# Patient Record
Sex: Female | Born: 1995 | Race: Black or African American | Hispanic: No | Marital: Single | Smoking: Never smoker
Health system: Southern US, Community
[De-identification: ages and names within clinical notes are randomized; demographics above are authoritative.]

---
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
## 2015-03-13 IMAGING — US US ABDOMEN LIMITED
1 series · 14 of 25 positions shown · non-contrast
Comparison: None
COMPARISON: None.

CLINICAL HISTORY: Pelvic pain

EXAM:
ULTRASOUND RIGHT LOWER QUADRANT
CLINICAL DATA: Pelvic pain
TRANSABDOMINAL ULTRASOUND OF PELVIS
TECHNIQUE: Transabdominal ultrasound examination of the pelvis was performed
including evaluation of the uterus, ovaries, adnexal regions, and
pelvic cul-de-sac.

[Series 1: us abdomen limited · 0.22mm/px · 14 of 63 slices shown]
[im 1/63]
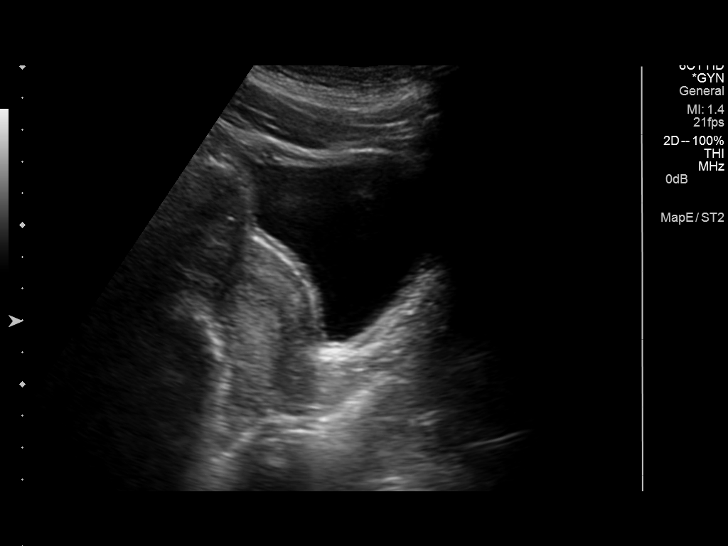
[im 6/63]
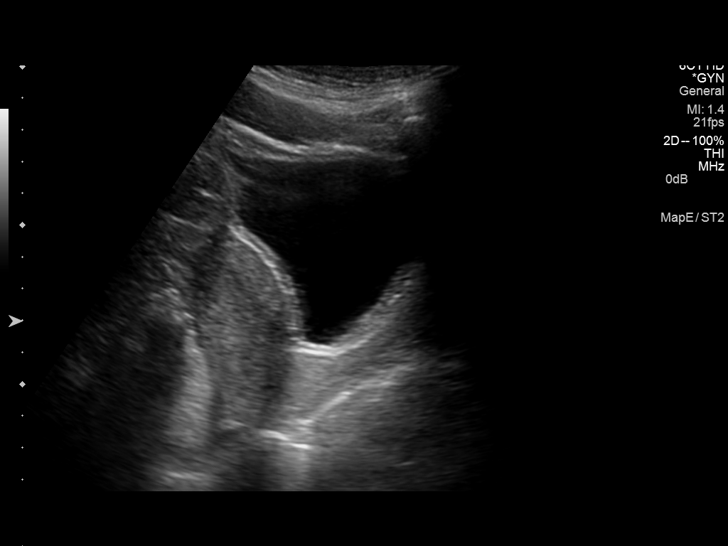
[im 11/63]
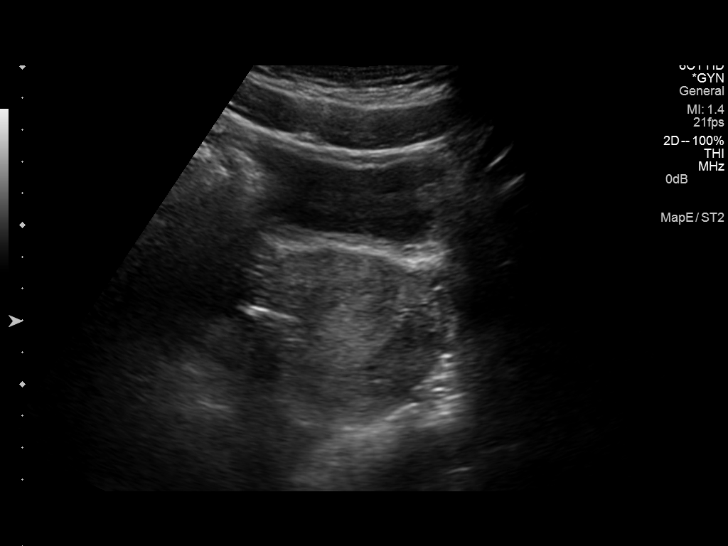
[im 16/63]
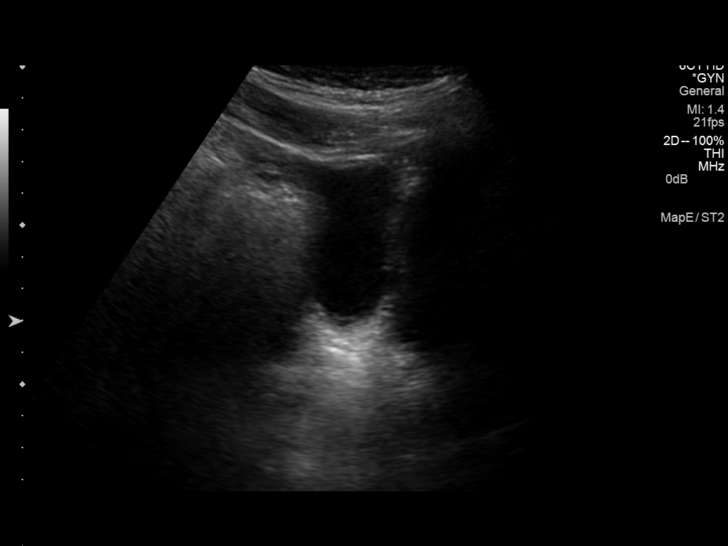
[im 21/63]
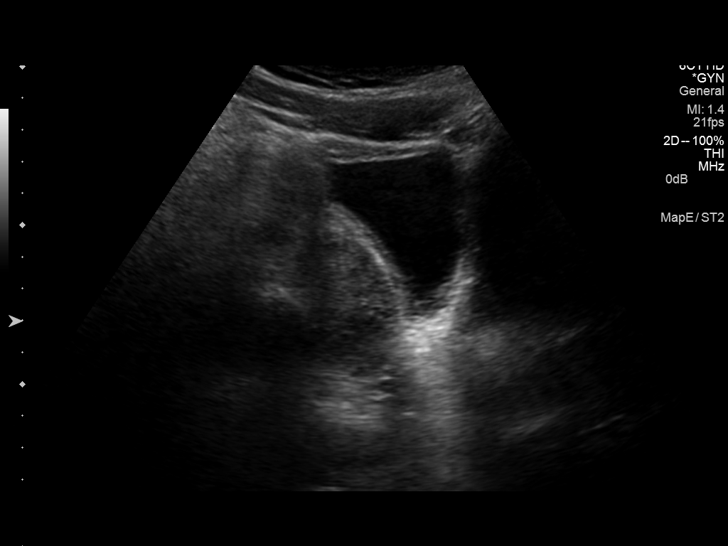
[im 24/63]
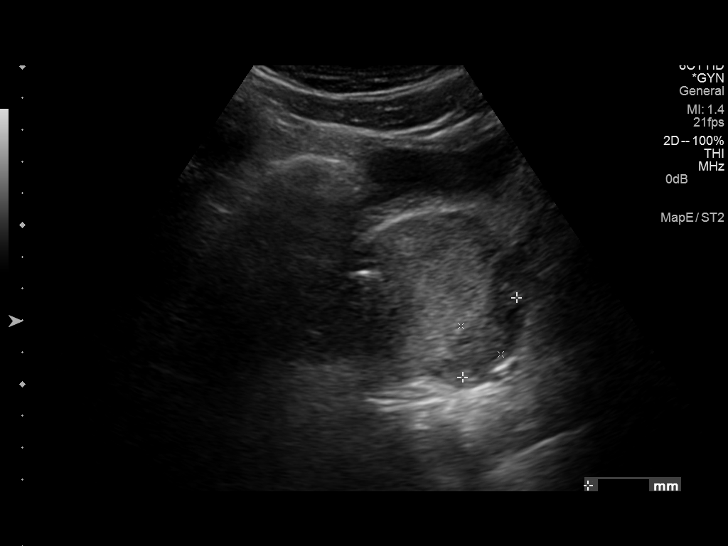
[im 29/63]
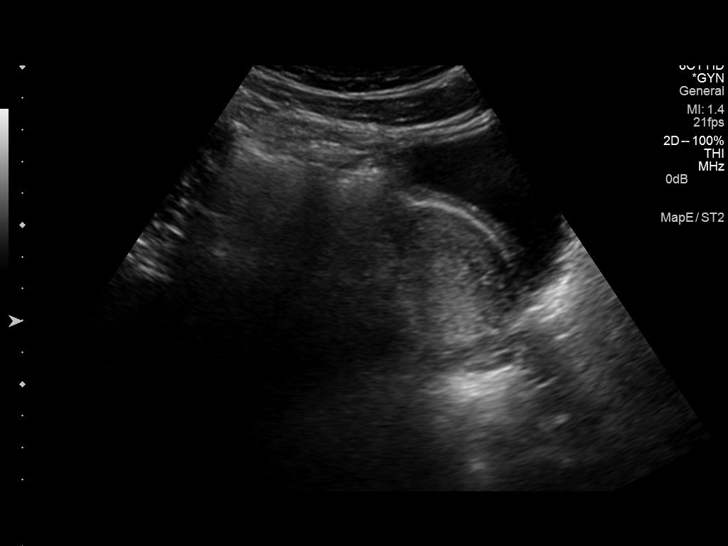
[im 34/63]
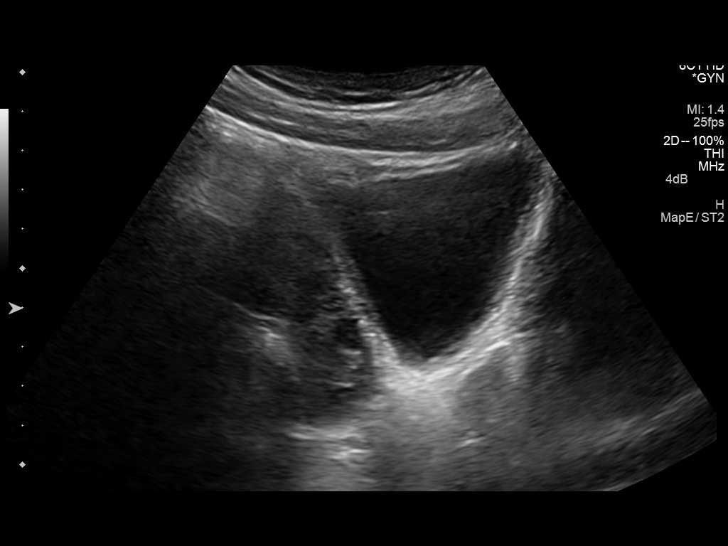
[im 39/63]
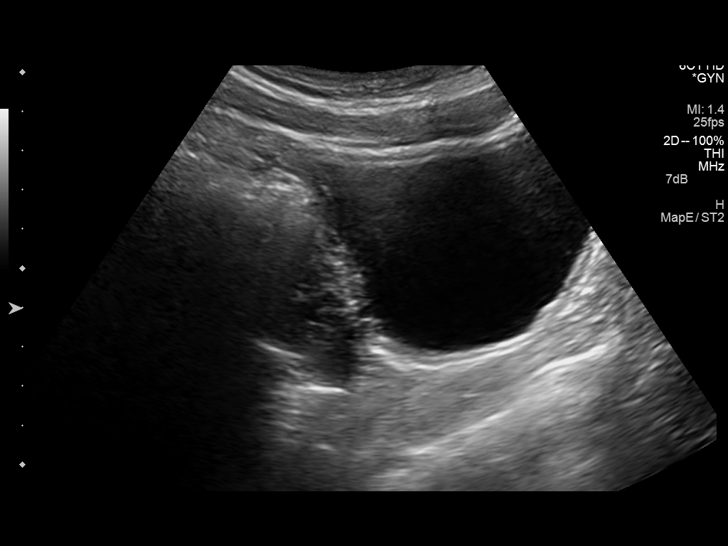
[im 42/63]
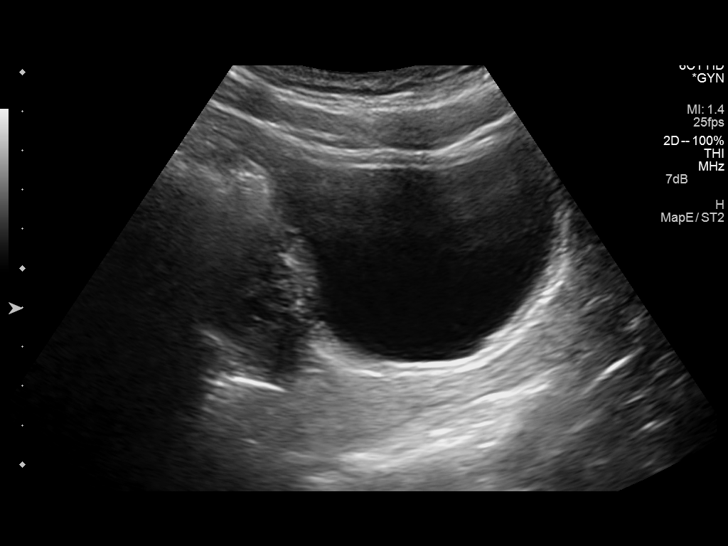
[im 47/63]
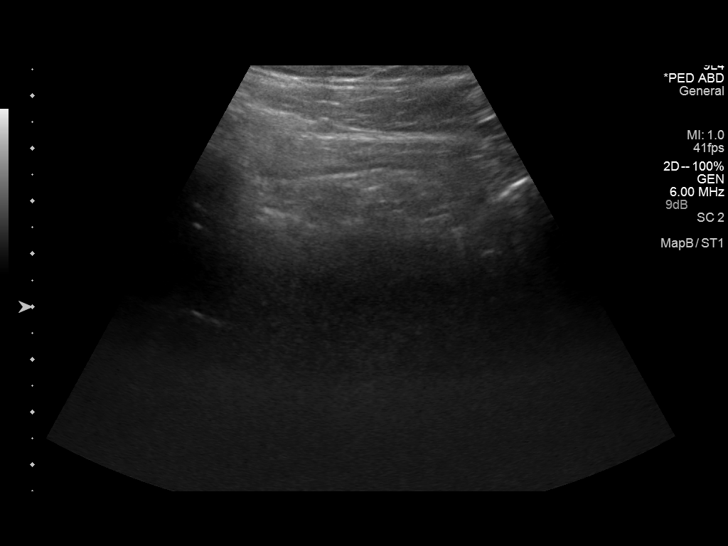
[im 52/63]
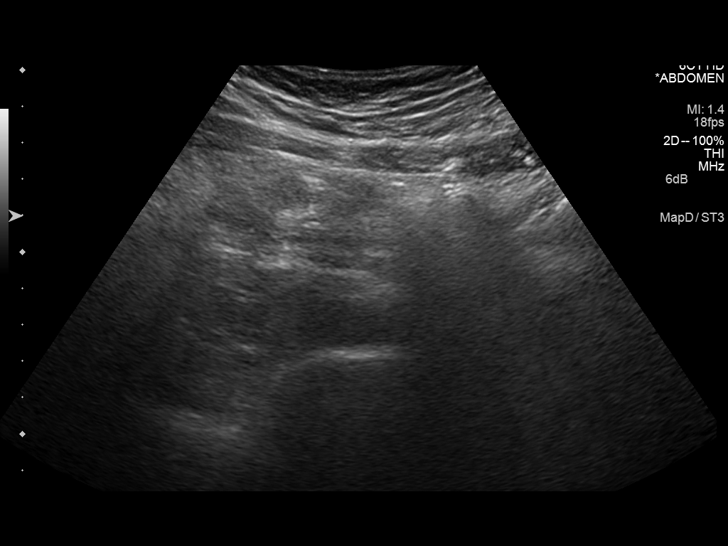
[im 57/63]
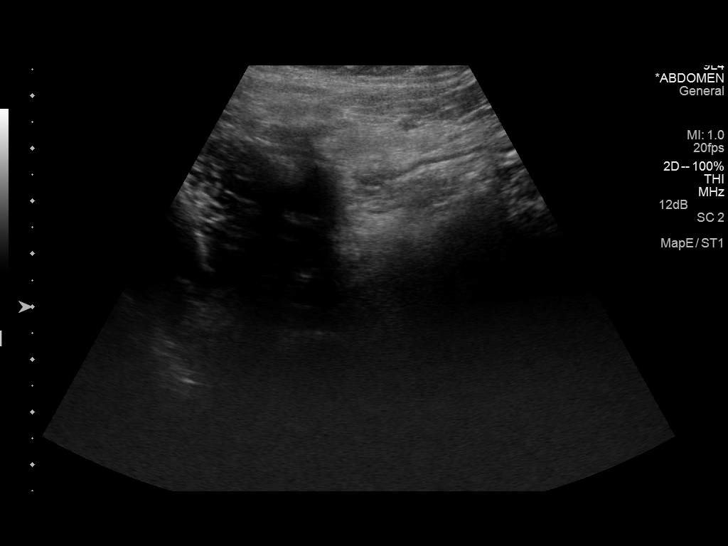
[im 63/63]
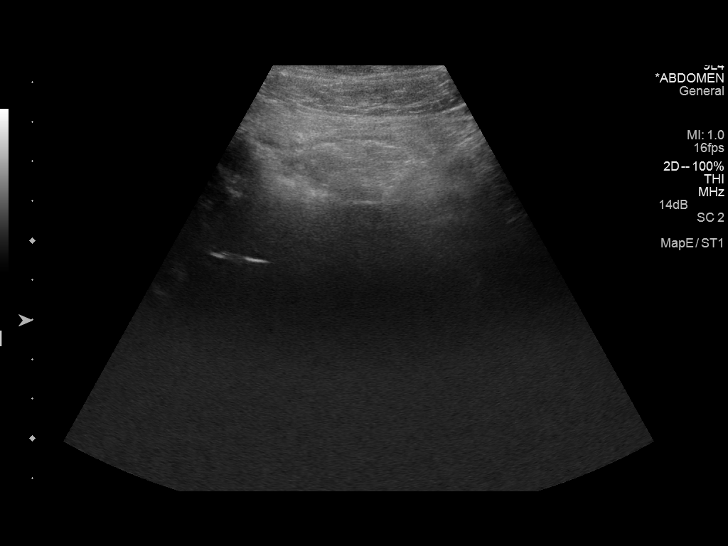

[14 of 25 positions shown; findings below may reference images not displayed]

FINDINGS: Ultrasound of the right lower quadrant was performed using graded
compression. There is no dilated tubular structure as is expected
with acute appendicitis. There is peristalsing bowel in this area.
No evidence of abscess or abnormal fluid. Note that normal appendix
is not seen.
IMPRESSION: No evidence of appendiceal inflammation in the right lower quadrant
by ultrasound. Normal appendix not seen on this study.
FINDINGS: Uterus

Measurements: 5.7 x 3.1 x 4.5 cm. No fibroids or other mass
visualized. Uterus is anteverted.

Endometrium

Thickness: 5 mm..  No focal abnormality visualized.

Right ovary

Measurements: 2.8 x 2.3 x 1.5 cm. Normal appearance/no adnexal mass.

Left ovary

Measurements: 3.0 x 1.5 x 1.1 cm. Normal appearance/no adnexal mass.

Other findings:  No free fluid
IMPRESSION: Study within normal limits.

## 2015-03-13 IMAGING — CT CT ABD-PELV W/ CM
2 of 4 series · 16 of 46 positions shown, 18 images · IV contrast (Omni 300)
Comparison: Abdominal ultrasound earlier today

CLINICAL DATA: Right greater than left lower abdominal/ pelvic pain
for 1 week. Normal white blood cell count. Recently diagnosed with
urinary tract infection.

EXAM:
CT ABDOMEN AND PELVIS WITH CONTRAST
TECHNIQUE: Multidetector CT imaging of the abdomen and pelvis was performed
using the standard protocol following bolus administration of
intravenous contrast.
CONTRAST:  100mL OMNIPAQUE IOHEXOL 300 MG/ML  SOLN

[Series 2: abd/ pelvis 5.0 i30f 1 · axial · 0.73mm/px · z∈[+1035,+1480]mm · 13 of 99 slices shown, 15 images]
[im 5/99  soft-tissue]
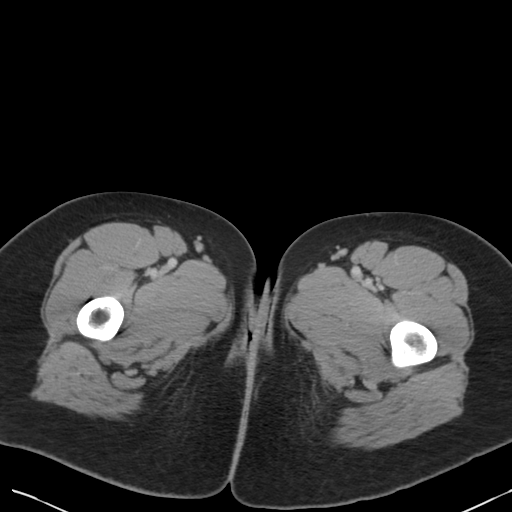
[im 5/99  bone]
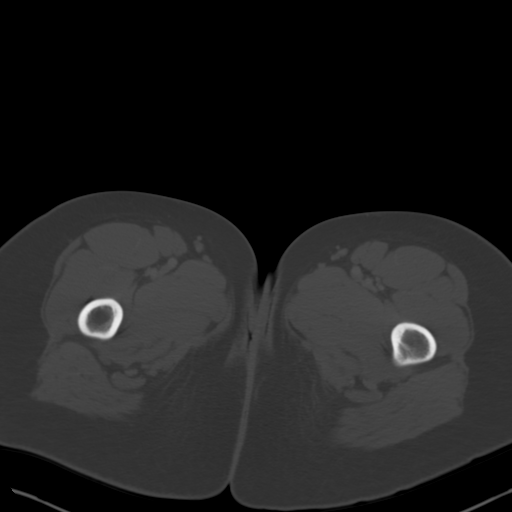
[im 13/99  soft-tissue]
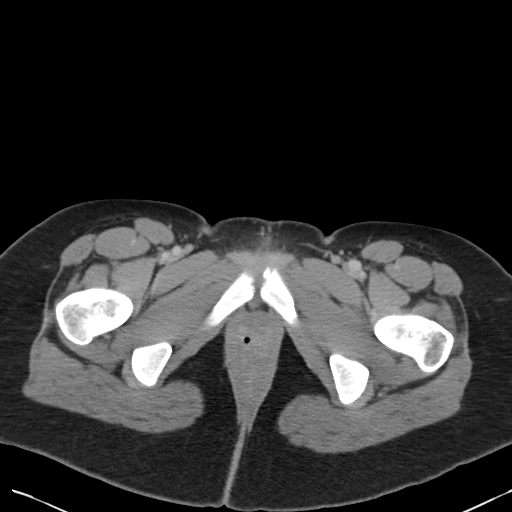
[im 22/99  soft-tissue]
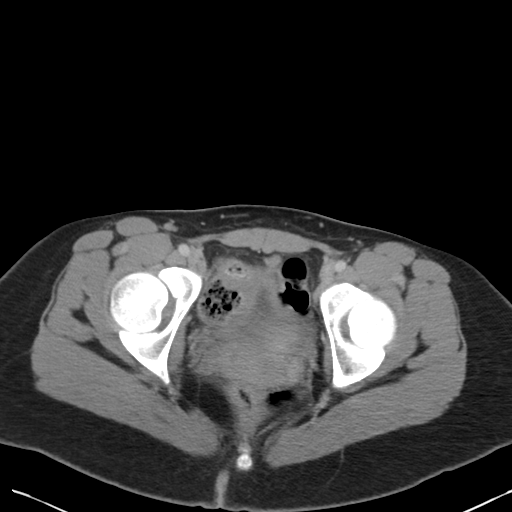
[im 26/99  soft-tissue]
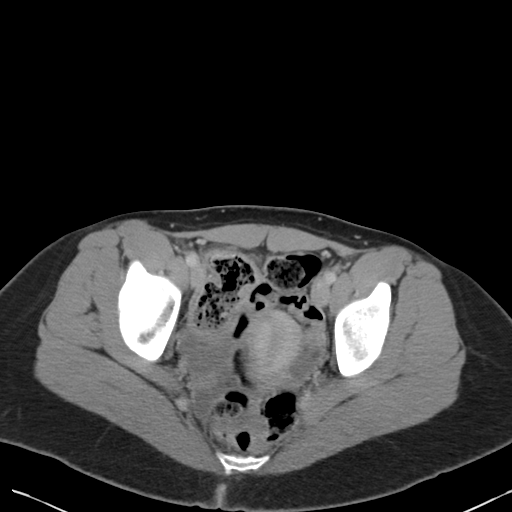
[im 35/99  soft-tissue]
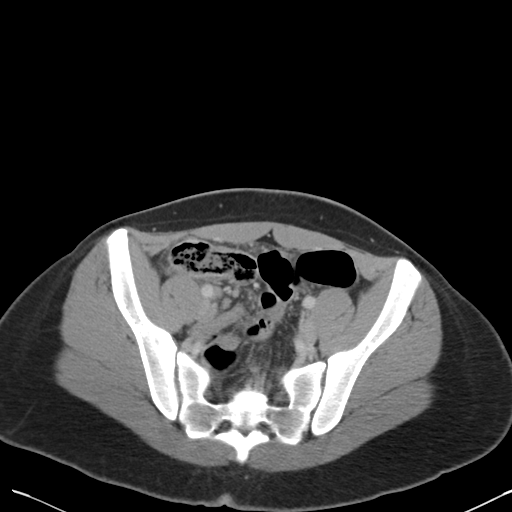
[im 43/99  soft-tissue]
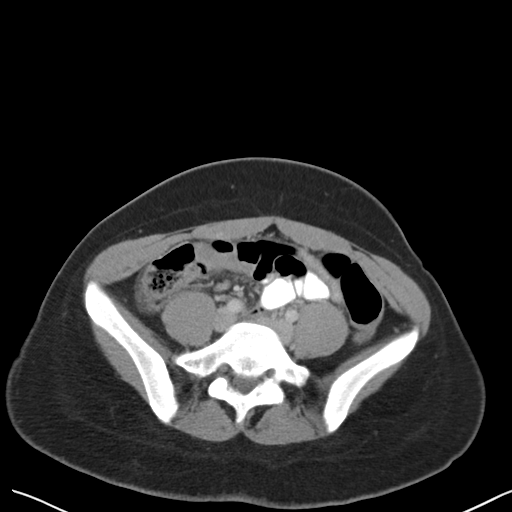
[im 52/99  soft-tissue]
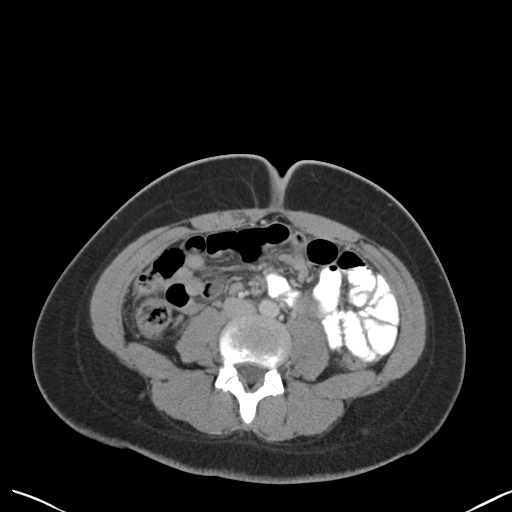
[im 56/99  soft-tissue]
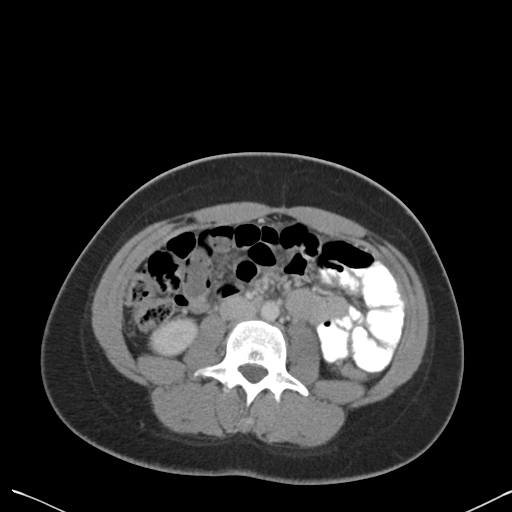
[im 64/99  soft-tissue]
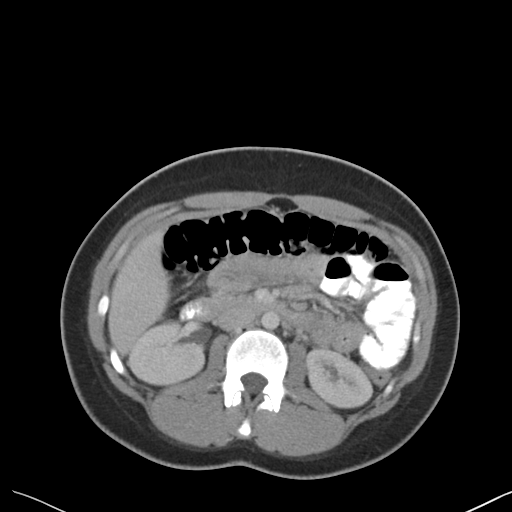
[im 64/99  bone]
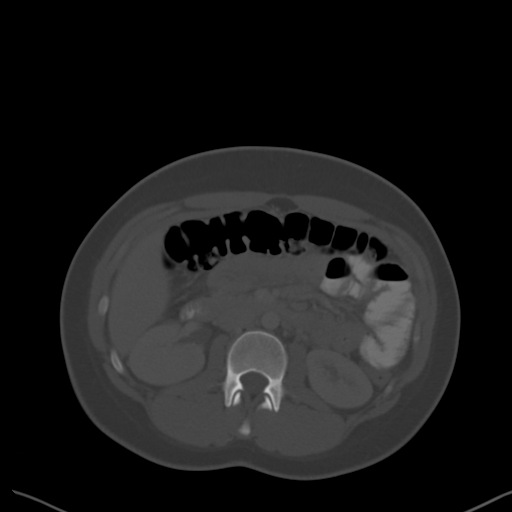
[im 73/99  soft-tissue]
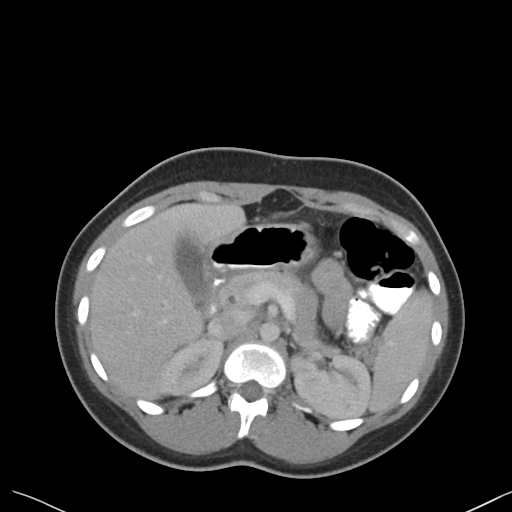
[im 77/99  soft-tissue]
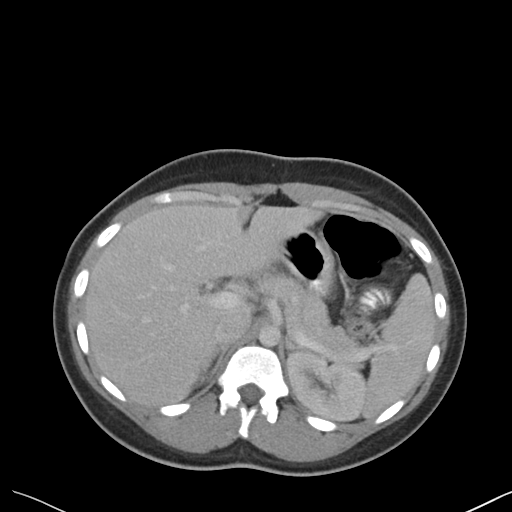
[im 86/99  soft-tissue]
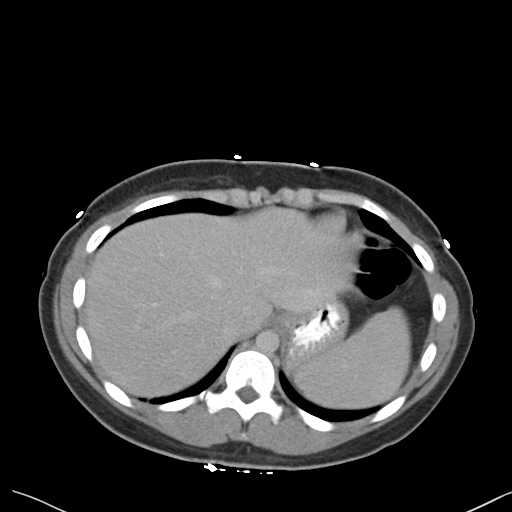
[im 94/99  soft-tissue]
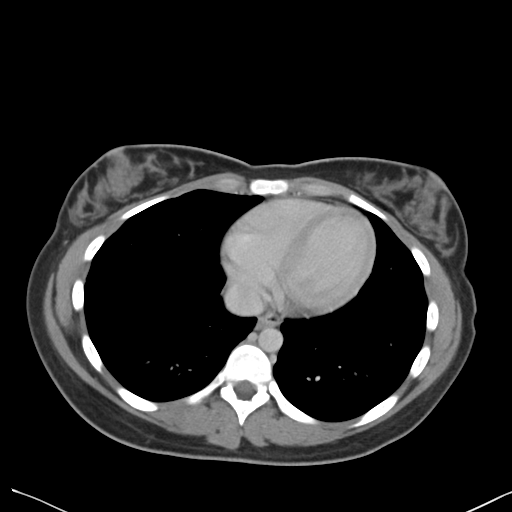

[Series 5: coronals · coronal · 0.72mm/px · 3 of 112 slices shown]
[im 38/112  soft-tissue]
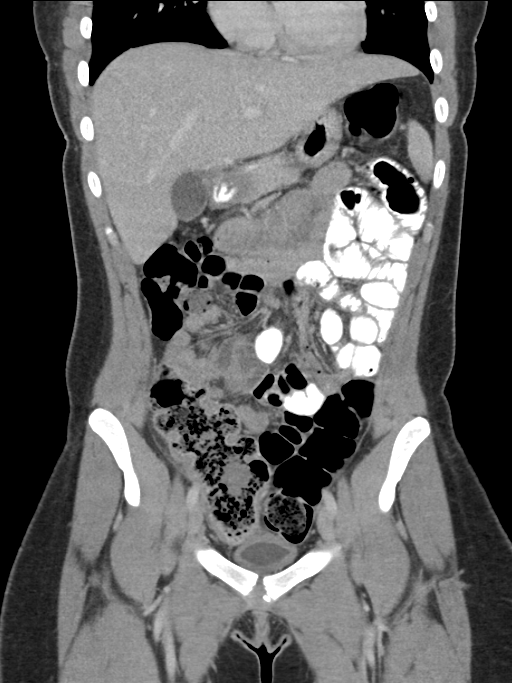
[im 50/112  soft-tissue]
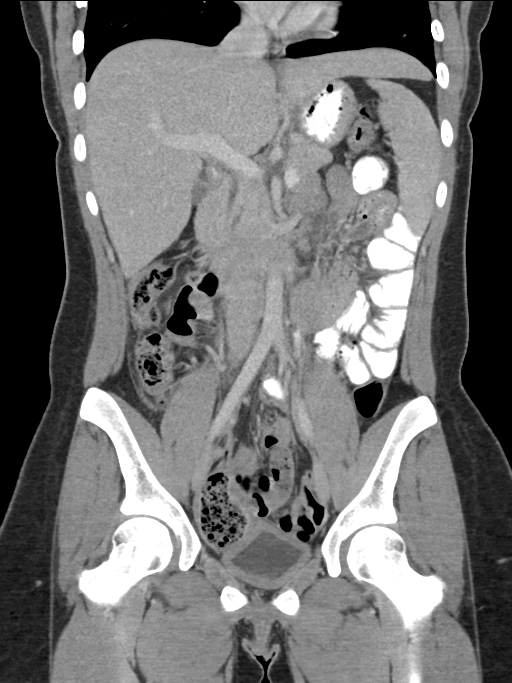
[im 62/112  soft-tissue]
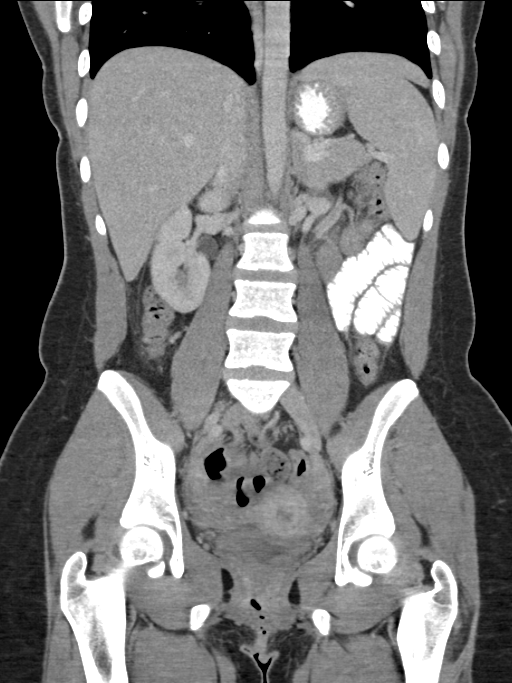

[16 of 46 positions shown; findings below may reference images not displayed]

FINDINGS: The visualized lung bases are clear.

The liver, gallbladder, spleen, adrenal glands, kidneys, and
pancreas have an unremarkable enhanced appearance.

Oral contrast is present in proximal small bowel. No bowel
dilatation is seen to suggest obstruction. The appendix is
identified in the right pelvis and contains gas without wall
thickening (series 2, images 58-67). Mild cecal wall thickening is
questioned. The terminal ileum is decompressed without gross wall
thickening identified. Mild inflammatory fat stranding is questioned
in the right lower quadrant about the cecum.

There is trace free fluid in the pelvis. The bladder is partially
decompressed but appears mildly thick walled. Mesenteric lymph nodes
measure up to 8 mm in short axis. No acute osseous abnormality is
identified.
IMPRESSION: 1. Normal appearance of the appendix without evidence of acute
appendicitis.
2. Suggestion of mild inflammatory changes in the right pelvis with
mild, diffuse bladder wall thickening and question of mild cecal
wall thickening. Considerations include cystitis with secondary
inflammation of the cecum. Mild infectious or inflammatory colitis
cannot be excluded.
3. Trace pelvic free fluid.

## 2018-01-10 ENCOUNTER — Inpatient Hospital Stay (HOSPITAL_COMMUNITY)
Admission: AD | Admit: 2018-01-10 | Discharge: 2018-01-12 | DRG: 885 | Disposition: A | Discharge status: Home / Self Care | Payer: 59 | Attending: Psychiatry | Admitting: Psychiatry

## 2018-01-10 ENCOUNTER — Other Ambulatory Visit: Payer: Self-pay

## 2018-01-10 ENCOUNTER — Encounter (HOSPITAL_COMMUNITY): Payer: Self-pay | Admitting: *Deleted

## 2018-01-10 DIAGNOSIS — Z62810 Personal history of physical and sexual abuse in childhood: Secondary | ICD-10-CM | POA: Diagnosis present

## 2018-01-10 DIAGNOSIS — Z62811 Personal history of psychological abuse in childhood: Secondary | ICD-10-CM | POA: Diagnosis not present

## 2018-01-10 DIAGNOSIS — F332 Major depressive disorder, recurrent severe without psychotic features: Principal | ICD-10-CM | POA: Diagnosis present

## 2018-01-10 DIAGNOSIS — R45851 Suicidal ideations: Secondary | ICD-10-CM | POA: Diagnosis present

## 2018-01-10 DIAGNOSIS — F419 Anxiety disorder, unspecified: Secondary | ICD-10-CM | POA: Diagnosis not present

## 2018-01-10 DIAGNOSIS — G4700 Insomnia, unspecified: Secondary | ICD-10-CM | POA: Diagnosis not present

## 2018-01-10 MED ORDER — ACETAMINOPHEN 325 MG PO TABS: 650.0000 mg | ORAL_TABLET | Freq: Four times a day (QID) | ORAL | Status: DC | PRN

## 2018-01-10 MED ORDER — TRAZODONE HCL 50 MG PO TABS
25.0000 mg | ORAL_TABLET | Freq: Every evening | ORAL | Status: DC | PRN
Start: 2018-01-10 — End: 2018-01-12
  Administered 2018-01-10 – 2018-01-11 (×2): 25 mg via ORAL
  Filled 2018-01-10 (×2): qty 1

## 2018-01-10 MED ORDER — ALUM & MAG HYDROXIDE-SIMETH 200-200-20 MG/5ML PO SUSP: 30.0000 mL | Freq: Four times a day (QID) | ORAL | Status: DC | PRN

## 2018-01-10 MED ORDER — HYDROXYZINE HCL 25 MG PO TABS
25.0000 mg | ORAL_TABLET | Freq: Four times a day (QID) | ORAL | Status: DC | PRN
  Administered 2018-01-11: 25 mg via ORAL
  Filled 2018-01-10: qty 1

## 2018-01-10 NOTE — H&P (Signed)
Behavioral Health Medical Screening Exam  Hayley Diaz is an 22 y.o. female. Patient is a 21 year old single female, in college , employed. She presents as walk in with friend. Reports worsening depression, sadness, decreased energy level, not wanting to get out of bed, suicidal ideations, with recent thoughts of cutting self . She reports recent break up with BF has been a contributor.  Total Time spent with patient: 30 minutes  Psychiatric Specialty Exam: Physical Exam Patient examined with female RN Mardella Layman) present . Lungs clear, symmetric, no wheezes or rales Heart- regular rate, no murmurs, no gallops, no rub noted . Neuro- alert, attentive, oriented x 3, no lateralization or weakness noted   ROS denies headache, no chest pain, no shortness of breath, no vomiting, no fever  There were no vitals taken for this visit.There is no height or weight on file to calculate BMI.  BP 113/70, pulse 83, Temp 98 O2 Sat 100 %  General Appearance: Well Groomed  Eye Contact:  Good  Speech:  Clear and Coherent  Volume:  Normal  Mood:  Depressed  Affect:  constricted, does smiles briefly /appropriately at times   Thought Process:  Linear and Descriptions of Associations: Intact  Orientation:  Full (Time, Place, and Person)  Thought Content:  no hallucinations, no delusions   Suicidal Thoughts:  No currently denies suicidal plan or intent  Homicidal Thoughts:  No  Memory:  recent and remote grossly intact   Judgement:  Fair  Insight:  Fair  Psychomotor Activity:  Normal  Concentration: Concentration: Good and Attention Span: Good  Recall:  Good  Fund of Knowledge:Good  Language: Good  Akathisia:  Negative  Handed:  Right  AIMS (if indicated):     Assets:  Communication Skills Desire for Improvement Resilience  Sleep:       Musculoskeletal: Strength & Muscle Tone: within normal limits Gait & Station: normal Patient leans: N/A  There were no vitals taken for this  visit.  Recommendations:  Based on my evaluation the patient does not appear to have an emergency medical condition.  Craige Cotta, MD 01/10/2018, 5:27 PM

## 2018-01-10 NOTE — Tx Team (Signed)
Initial Treatment Plan 01/10/2018 6:53 PM Hayley Diaz WUJ:[id number]    PATIENT STRESSORS: Educational concerns Loss of relationship with boyfriend   PATIENT STRENGTHS: Ability for insight Average or above average intelligence Capable of independent living Communication skills General fund of knowledge Motivation for treatment/growth Physical Health   PATIENT IDENTIFIED PROBLEMS: Depression Suicidal thoughts and actions "I need to figure out how to get out of the mindset that I don't matter"                     DISCHARGE CRITERIA:  Ability to meet basic life and health needs Improved stabilization in mood, thinking, and/or behavior Reduction of life-threatening or endangering symptoms to within safe limits Verbal commitment to aftercare and medication compliance  PRELIMINARY DISCHARGE PLAN: Attend aftercare/continuing care group Return to previous living arrangement  PATIENT/FAMILY INVOLVEMENT: This treatment plan has been presented to and reviewed with the patient, Hayley Diaz, and/or family member, .  The patient and family have been given the opportunity to ask questions and make suggestions.  Hagop Mccollam, [place], [place] 01/10/2018, 6:53 PM

## 2018-01-10 NOTE — Progress Notes (Signed)
D: Pt was in the dayroom upon initial approach.  Pt presents with depressed affect and mood.  She reports she is still feeling depressed.  Her goal is to "get some good rest."  Pt denies SI/HI, denies hallucinations, denies pain.  Pt has been visible in milieu with few peer interactions.      A: Introduced self to pt.  Actively listened to pt and offered support and encouragement. Pt informed urine specimen is needed and she was provided with specimen cup.  Q15 minute safety checks maintained.  R: Pt is safe on the unit.  She still has not provided urine specimen.  Pt verbally contracts for safety.  Will continue to monitor and assess.
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
## 2018-01-10 NOTE — Progress Notes (Signed)
Adult Psychoeducational Group Note  Date:  01/10/2018 Time:  9:27 PM  Group Topic/Focus:  Wrap-Up Group:   The focus of this group is to help patients review their daily goal of treatment and discuss progress on daily workbooks.  Participation Level:  Active  Participation Quality:  Appropriate  Affect:  Appropriate  Cognitive:  Appropriate  Insight: Appropriate  Engagement in Group:  Engaged  Modes of Intervention:  Discussion  Additional Comments: The patient expressed that she recently arrived to [facility].  Octavio Manns 01/10/2018, 9:27 PM

## 2018-01-11 DIAGNOSIS — Z62810 Personal history of physical and sexual abuse in childhood: Secondary | ICD-10-CM

## 2018-01-11 DIAGNOSIS — F332 Major depressive disorder, recurrent severe without psychotic features: Principal | ICD-10-CM

## 2018-01-11 DIAGNOSIS — Z62811 Personal history of psychological abuse in childhood: Secondary | ICD-10-CM

## 2018-01-11 LAB — CBC WITH DIFFERENTIAL/PLATELET
Basophils Absolute: 0 10*3/uL (ref 0.0–0.1)
Basophils Relative: 1 %
Eosinophils Absolute: 0.1 10*3/uL (ref 0.0–0.7)
Eosinophils Relative: 2 %
HCT: 41.2 % (ref 36.0–46.0)
Hemoglobin: 13.1 g/dL (ref 12.0–15.0)
LYMPHS ABS: 2.2 10*3/uL (ref 0.7–4.0)
LYMPHS PCT: 50 %
MCH: 27 pg (ref 26.0–34.0)
MCHC: 31.8 g/dL (ref 30.0–36.0)
MCV: 84.8 fL (ref 78.0–100.0)
Monocytes Absolute: 0.4 10*3/uL (ref 0.1–1.0)
Monocytes Relative: 9 %
Neutro Abs: 1.7 10*3/uL (ref 1.7–7.7)
Neutrophils Relative %: 38 %
Platelets: 251 10*3/uL (ref 150–400)
RBC: 4.86 MIL/uL (ref 3.87–5.11)
RDW: 13.7 % (ref 11.5–15.5)
WBC: 4.3 10*3/uL (ref 4.0–10.5)

## 2018-01-11 LAB — COMPREHENSIVE METABOLIC PANEL
ALBUMIN: 4.4 g/dL (ref 3.5–5.0)
ALK PHOS: 42 U/L (ref 38–126)
ALT: 15 U/L (ref 0–44)
ANION GAP: 10 (ref 5–15)
AST: 20 U/L (ref 15–41)
BUN: 10 mg/dL (ref 6–20)
CALCIUM: 9.9 mg/dL (ref 8.9–10.3)
CO2: 24 mmol/L (ref 22–32)
Chloride: 109 mmol/L (ref 98–111)
Creatinine, Ser: 0.77 mg/dL (ref 0.44–1.00)
GFR calc Af Amer: >60 mL/min (ref >60)
GFR calc non Af Amer: >60 mL/min (ref >60)
Glucose, Bld: 95 mg/dL (ref 70–99)
POTASSIUM: 3.9 mmol/L (ref 3.5–5.1)
Sodium: 143 mmol/L (ref 135–145)
TOTAL PROTEIN: 8 g/dL (ref 6.5–8.1)
Total Bilirubin: 0.8 mg/dL (ref 0.3–1.2)

## 2018-01-11 LAB — RAPID URINE DRUG SCREEN, HOSP PERFORMED
Amphetamines: NOT DETECTED
BENZODIAZEPINES: NOT DETECTED
Barbiturates: NOT DETECTED
COCAINE: NOT DETECTED
OPIATES: NOT DETECTED
Tetrahydrocannabinol: NOT DETECTED

## 2018-01-11 LAB — URINALYSIS, ROUTINE W REFLEX MICROSCOPIC
Bilirubin Urine: NEGATIVE
Glucose, UA: NEGATIVE mg/dL
Hgb urine dipstick: NEGATIVE
Ketones, ur: NEGATIVE mg/dL
LEUKOCYTES UA: NEGATIVE
Nitrite: NEGATIVE
Protein, ur: NEGATIVE mg/dL
SPECIFIC GRAVITY, URINE: 1.031 — AB (ref 1.005–1.030)
pH: 5 (ref 5.0–8.0)

## 2018-01-11 LAB — PREGNANCY, URINE: Preg Test, Ur: NEGATIVE

## 2018-01-11 LAB — TSH: TSH: 3.009 u[IU]/mL (ref 0.350–4.500)

## 2018-01-11 MED ORDER — ESCITALOPRAM OXALATE 5 MG PO TABS
5.0000 mg | ORAL_TABLET | Freq: Every day | ORAL | Status: DC
  Administered 2018-01-11 – 2018-01-12 (×2): 5 mg via ORAL
  Filled 2018-01-11 (×5): qty 1

## 2018-01-11 NOTE — Tx Team (Addendum)
Interdisciplinary Treatment and Diagnostic Plan Update  01/12/2018 Time of Session: [place] MRN: [id number]  Principal Diagnosis: <principal problem not specified>  Secondary Diagnoses: Active Problems:   MDD (major depressive disorder), recurrent episode, severe (HCC)   Current Medications:  Current Facility-Administered Medications  Medication Dose Route Frequency Provider Last Rate Last Dose  . acetaminophen (TYLENOL) tablet 650 mg  650 mg Oral Q6H PRN Cobos, Myer Peer, MD      . alum & mag hydroxide-simeth (MAALOX/MYLANTA) 200-200-20 MG/5ML suspension 30 mL  30 mL Oral Q6H PRN Cobos, Fernando A, MD      . escitalopram (LEXAPRO) tablet 5 mg  5 mg Oral Daily Cobos, Myer Peer, MD   5 mg at 01/11/18 1703  . hydrOXYzine (ATARAX/VISTARIL) tablet 25 mg  25 mg Oral Q6H PRN Cobos, Myer Peer, MD   25 mg at 01/11/18 2228  . traZODone (DESYREL) tablet 25 mg  25 mg Oral QHS PRN,MR X 1 Cobos, Myer Peer, MD   25 mg at 01/11/18 2228   PTA Medications: No medications prior to admission.    Patient Stressors: Educational concerns Loss of relationship with boyfriend  Patient Strengths: Ability for insight Average or above average intelligence Capable of independent living Curator fund of knowledge Motivation for treatment/growth Physical Health  Treatment Modalities: Medication Management, Group therapy, Case management,  1 to 1 session with clinician, Psychoeducation, Recreational therapy.   Physician Treatment Plan for Primary Diagnosis: <principal problem not specified> Long Term Goal(s): Improvement in symptoms so as ready for discharge Improvement in symptoms so as ready for discharge   Short Term Goals: Ability to identify changes in lifestyle to reduce recurrence of condition will improve Ability to maintain clinical measurements within normal limits will improve Ability to identify changes in lifestyle to reduce recurrence of condition will  improve Ability to verbalize feelings will improve Ability to disclose and discuss suicidal ideas Ability to demonstrate self-control will improve Ability to identify and develop effective coping behaviors will improve Ability to maintain clinical measurements within normal limits will improve  Medication Management: Evaluate patient's response, side effects, and tolerance of medication regimen.  Therapeutic Interventions: 1 to 1 sessions, Unit Group sessions and Medication administration.  Evaluation of Outcomes: Not Met  Physician Treatment Plan for Secondary Diagnosis: Active Problems:   MDD (major depressive disorder), recurrent episode, severe ([place])  Long Term Goal(s): Improvement in symptoms so as ready for discharge Improvement in symptoms so as ready for discharge   Short Term Goals: Ability to identify changes in lifestyle to reduce recurrence of condition will improve Ability to maintain clinical measurements within normal limits will improve Ability to identify changes in lifestyle to reduce recurrence of condition will improve Ability to verbalize feelings will improve Ability to disclose and discuss suicidal ideas Ability to demonstrate self-control will improve Ability to identify and develop effective coping behaviors will improve Ability to maintain clinical measurements within normal limits will improve     Medication Management: Evaluate patient's response, side effects, and tolerance of medication regimen.  Therapeutic Interventions: 1 to 1 sessions, Unit Group sessions and Medication administration.  Evaluation of Outcomes: Not Met   RN Treatment Plan for Primary Diagnosis: <principal problem not specified> Long Term Goal(s): Knowledge of disease and therapeutic regimen to maintain health will improve  Short Term Goals: Ability to identify and develop effective coping behaviors will improve and Compliance with prescribed medications will improve  Medication  Management: RN will administer medications as ordered by provider,  will assess and evaluate patient's response and provide education to patient for prescribed medication. RN will report any adverse and/or side effects to prescribing provider.  Therapeutic Interventions: 1 on 1 counseling sessions, Psychoeducation, Medication administration, Evaluate responses to treatment, Monitor vital signs and CBGs as ordered, Perform/monitor CIWA, COWS, AIMS and Fall Risk screenings as ordered, Perform wound care treatments as ordered.  Evaluation of Outcomes: Not Met   LCSW Treatment Plan for Primary Diagnosis: <principal problem not specified> Long Term Goal(s): Safe transition to appropriate next level of care at discharge, Engage patient in therapeutic group addressing interpersonal concerns.  Short Term Goals: Engage patient in aftercare planning with referrals and resources, Increase social support and Increase skills for wellness and recovery  Therapeutic Interventions: Assess for all discharge needs, 1 to 1 time with Social worker, Explore available resources and support systems, Assess for adequacy in community support network, Educate family and significant other(s) on suicide prevention, Complete Psychosocial Assessment, Interpersonal group therapy.  Evaluation of Outcomes: Not Met   Progress in Treatment: Attending groups: No. Participating in groups: No. Taking medication as prescribed: Yes. Toleration medication: Yes. Family/Significant other contact made: No, will contact:  when given permission Patient understands diagnosis: Yes. Discussing patient identified problems/goals with staff: Yes. Medical problems stabilized or resolved: Yes. Denies suicidal/homicidal ideation: Yes. Issues/concerns per patient self-inventory: No. Other: none  New problem(s) identified: No, Describe:  none  New Short Term/Long Term Goal(s):  Patient Goals:  "need a break need to talk to  someone."  Discharge Plan or Barriers:   Reason for Continuation of Hospitalization: Depression Medication stabilization  Estimated Length of Stay: 3-5 days.  Attendees: Patient: Hayley Diaz 01/11/2018   Physician: Dr. Parke Poisson, MD 01/11/2018   Nursing: Mayra Neer, RN 01/11/2018   RN Care Manager: 01/11/2018   Social Worker: Lurline Idol, LCSW 01/11/2018   Recreational Therapist:  01/11/2018   Other:  01/11/2018   Other:  01/11/2018   Other: 01/11/2018          Scribe for Treatment Team: Joanne Chars, [place] 01/12/2018 7:55 AM
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
## 2018-01-11 NOTE — Progress Notes (Addendum)
Pt requested and received Trazodone 25 mg PO PRN for sleep.  Medication education provided.  Fall prevention techniques reviewed with pt and she verbalized understanding.  Urine specimen still not provided.  Will continue to monitor and assess.

## 2018-01-11 NOTE — [facility] Suicide Risk Assessment (Signed)
[facility] INPATIENT:  Family/Significant Other Suicide Prevention Education  Suicide Prevention Education:  Education Completed; Elease Hashimoto, mother, [phone] has been identified by the patient as the family member/significant other with whom the patient will be residing, and identified as the person(s) who will aid the patient in the event of a mental health crisis (suicidal ideations/suicide attempt).  With written consent from the patient, the family member/significant other has been provided the following suicide prevention education, prior to the and/or following the discharge of the patient.  The suicide prevention education provided includes the following:  Suicide risk factors  Suicide prevention and interventions  National Suicide Hotline telephone number  [facility] assessment telephone number  [facility] Emergency Assistance 911  [facility] and/or Residential Mobile Crisis Unit telephone number  Request made of family/significant other to:  Remove weapons (e.g., guns, rifles, knives), all items previously/currently identified as safety concern.    Remove drugs/medications (over-the-counter, prescriptions, illicit drugs), all items previously/currently identified as a safety concern.  The family member/significant other verbalizes understanding of the suicide prevention education information provided.  The family member/significant other agrees to remove the items of safety concern listed above.  Ida Rogue 01/11/2018, 2:52 PM

## 2018-01-11 NOTE — Progress Notes (Signed)
Patient self inventory- Patient slept fair last night, sleep medication was requested and was not helpful. Appetite has been fair, energy level normal and concentration good. Depression, hopelessness, and anxiety rated 4, 4, 2. Denies SI HI AVH. Denies physical pain. Patient's goal is "releasing bad energy and breathing (just taking a break to appreciate being alive).  Patient is compliant with medications prescribed per provider. Safety is maintained with 15 minute checks as well as environmental checks. Will continue to monitor.

## 2018-01-11 NOTE — [facility] Group Notes (Signed)
[facility] Mental [employer] Association Group Therapy 01/11/2018 1:15pm  Type of Therapy: Mental Health Association Presentation  Participation Level: Active  Participation Quality: Attentive  Affect: Appropriate  Cognitive: Oriented  Insight: Developing/Improving  Engagement in Therapy: Engaged  Modes of Intervention: Discussion, Education and Socialization  Summary of Progress/Problems: Mental Health Association (MHA) Speaker came to talk about his personal journey with mental health. The pt processed ways by which to relate to the speaker. MHA speaker provided handouts and educational information pertaining to groups and services offered by the [facility]. Pt was engaged in speaker's presentation and was receptive to resources provided.    Lorri Frederick, LCSW 01/11/2018 3:12 PM

## 2018-01-11 NOTE — Progress Notes (Signed)
Recreation Therapy Notes  Date: 10.2.19 Time: 0930 Location: [address] Dayroom  Group Topic: Stress Management  Goal Area(s) Addresses:  Patient will verbalize importance of using healthy stress management.  Patient will identify positive emotions associated with healthy stress management.   Intervention: Stress Management  Activity :  Guided Imagery.  LRT introduced the stress management technique of guided imagery.  LRT read a script taking the patients on a journey on the beach at sunrise.  Patients were to listen and follow along as script was read.  Education:  Stress Management, Discharge Planning.   Education Outcome: Acknowledges edcuation/In group clarification offered/Needs additional education  Clinical Observations/Feedback: Pt did not attend group.    Nyellie Yetter, LRT/CTRS         Tajana Crotteau A 01/11/2018 11:40 AM 

## 2018-01-11 NOTE — Progress Notes (Signed)
D: Pt denies SI/HI/AVH. Pt is pleasant and cooperative. Pt visible on the unit, pt stated she was "better, I need to work on me"  A: Pt was offered support and encouragement. Pt was given scheduled medications. Pt was encourage to attend groups. Q 15 minute checks were done for safety.   R:Pt attends groups and interacts well with peers and staff. Pt is taking medication. Pt has no complaints.Pt receptive to treatment and safety maintained on unit.  Problem: Education: Goal: Emotional status will improve Outcome: Progressing   Problem: Education: Goal: Mental status will improve Outcome: Progressing   Problem: Activity: Goal: Interest or engagement in activities will improve Outcome: Progressing

## 2018-01-12 DIAGNOSIS — F419 Anxiety disorder, unspecified: Secondary | ICD-10-CM

## 2018-01-12 DIAGNOSIS — G4700 Insomnia, unspecified: Secondary | ICD-10-CM

## 2018-01-12 MED ORDER — ESCITALOPRAM OXALATE 5 MG PO TABS: 5.0000 mg | ORAL_TABLET | Freq: Every day | ORAL | 0 refills | Status: AC

## 2018-01-12 MED ORDER — TRAZODONE HCL 50 MG PO TABS: 25.0000 mg | ORAL_TABLET | Freq: Every evening | ORAL | 0 refills | Status: AC | PRN

## 2018-01-12 MED ORDER — HYDROXYZINE HCL 25 MG PO TABS: 25.0000 mg | ORAL_TABLET | Freq: Four times a day (QID) | ORAL | 0 refills | Status: AC | PRN

## 2018-01-12 NOTE — Discharge Summary (Addendum)
Physician Discharge Summary Note  Patient:  Hayley Diaz is an 22 y.o., female  MRN:  [id number]  DOB:  11/12/1995  Patient phone:  [phone] (home)   Patient address:   [address] [place]  [address],   Total Time spent with patient: Greater than 30 minutes  Date of Admission:  01/10/2018  Date of Discharge: 01-12-18  Reason for Admission: Worsening depression & suicidal ideations.  Principal Problem: MDD (major depressive disorder), recurrent episode, severe (HCC)  Discharge Diagnoses: Patient Active Problem List   Diagnosis Date Noted  . MDD (major depressive disorder), recurrent episode, severe (HCC) [F33.2] 01/10/2018   Past Psychiatric History: See H&P  Past Medical History: History reviewed. No pertinent past medical history. History reviewed. No pertinent surgical history.  Family History: History reviewed. No pertinent family history.  Family Psychiatric  History: See H&P  Social History:  Social History   Substance and Sexual Activity  Alcohol Use Yes     Social History   Substance and Sexual Activity  Drug Use Not Currently    Social History   Socioeconomic History  . Marital status: Single    Spouse name: Not on file  . Number of children: Not on file  . Years of education: Not on file  . Highest education level: Not on file  Occupational History  . Not on file  Social Needs  . Financial resource strain: Not on file  . Food insecurity:    Worry: Not on file    Inability: Not on file  . Transportation needs:    Medical: Not on file    Non-medical: Not on file  Tobacco Use  . Smoking status: Never Smoker  . Smokeless tobacco: Never Used  Substance and Sexual Activity  . Alcohol use: Yes  . Drug use: Not Currently  . Sexual activity: Yes    Birth control/protection: None  Lifestyle  . Physical activity:    Days per week: Not on file    Minutes per session: Not on file  . Stress: Not on file  Relationships  . Social  connections:    Talks on phone: Not on file    Gets together: Not on file    Attends religious service: Not on file    Active member of club or organization: Not on file    Attends meetings of clubs or organizations: Not on file    Relationship status: Not on file  Other Topics Concern  . Not on file  Social History Narrative  . Not on file   Hospital Course: (Per Md's admission notes): 22 year old single female, college student, presented to [facility] voluntarily. Reports she has been feeling depressed, sad, easily overwhelmed. States she has chronic depression, but feels it has worsened recently, which she attributes partly to being a Senior and having anxiety of how she will do after graduating and to strained relationship with mother. She also reports recent break up, but states " I broke up because I felt I needed to focus more on myself".She reports she was having passive suicidal ideations" I just felt maybe it would be better if I was not alive anymore, like what was the point". She states that on the day of admission had been having thoughts of self cutting.  After the above  admission assessment, Hayley Diaz was started on the medication regimen for her presenting symptoms. She was enrolled in the group counseling sessions being offered & held on this unit to learn coping skills   that should help her to cope better & manage her mental health issues & maintain mood stability after discharge.   During the follow-up care assessment this morning, Hayley Diaz met with her attending psychiatrist. She presented with a good affect, good eye contact, is alert & oriented x 3. She is aware of situation & able to make concrete decisions/requests. She was not having any suicidal thoughts or ideations. She says she is mentally & medically stable. She denies any depressive or anxiety symptoms. She denies any HI, AVH, delusional thoughts or paranoia. And because there are no clinical criteria to keep Hayley Diaz admitted to  the hospital, she is being discharged as requested to her place of residence with family. She is committed to continuing mental health care on an outpatient basis as noted below.   Upon discharge, Hayley Diaz appears much more in control of her mood & behavior. Her symptoms were reported as significantly improved or completely resolved. There are currently, no active SI plans or intent, AVH, delusional thoughts or paranoia. No agitated behaviors, presents calm. She is tolerating medication (Lexapro) well, denies any side effects. The side effects of Lexapro reviewed with patient, including risk of GI side effects and potential for increased suicidal ideations in young adults early in treatment with antidepressants. Patient verbalized her understanding of this information. Patient reports good visit/converation with mother yesterday evening. With patient's express consent, the attending psychiatrist did speak with patient's mother, who visited her yesterday. Mother corroborates patient doing well, improved and is in agreement with discharge. Hayley Diaz is going to pursue outpatient treatment in her own terms as noted below. She was provided with prescriptions. She left [facility] with all personal belongings in no apparent distress.   Physical Findings: AIMS: Facial and Oral Movements Muscles of Facial Expression: None, normal Lips and Perioral Area: None, normal Jaw: None, normal Tongue: None, normal,Extremity Movements Upper (arms, wrists, hands, fingers): None, normal Lower (legs, knees, ankles, toes): None, normal, Trunk Movements Neck, shoulders, hips: None, normal, Overall Severity Severity of abnormal movements (highest score from questions above): None, normal Incapacitation due to abnormal movements: None, normal Patient's awareness of abnormal movements (rate only patient's report): No Awareness, Dental Status Current problems with teeth and/or dentures?: No Does patient usually wear dentures?: No   CIWA:    COWS:     Musculoskeletal: Strength & Muscle Tone: within normal limits Gait & Station: normal Patient leans: N/A  Psychiatric Specialty Exam: Physical Exam  Nursing note and vitals reviewed. Constitutional: She is oriented to person, place, and time. She appears well-developed.  HENT:  Head: Normocephalic.  Eyes: Pupils are equal, round, and reactive to light.  Neck: Normal range of motion.  Cardiovascular: Normal rate.  Respiratory: Effort normal.  GI: Soft.  Genitourinary:  Genitourinary Comments: Deferred  Musculoskeletal: Normal range of motion.  Neurological: She is alert and oriented to person, place, and time.  Skin: Skin is warm.    Review of Systems  Constitutional: Negative.   HENT: Negative.   Eyes: Negative.   Respiratory: Negative.   Cardiovascular: Negative.   Gastrointestinal: Negative.   Genitourinary: Negative.   Musculoskeletal: Negative.   Skin: Negative.   Neurological: Negative.   Endo/Heme/Allergies: Negative.   Psychiatric/Behavioral: Positive for depression (Stable). Negative for hallucinations, memory loss, substance abuse and suicidal ideas. The patient has insomnia (Stable). The patient is not nervous/anxious.     Blood pressure 107/77, pulse 65, temperature 98.2 F (36.8 C), temperature source Oral, resp. rate 18, height 5' 8" (1.727 m), weight 72.6   kg.Body mass index is 24.33 kg/m.  See Md's SRA   Have you used any form of tobacco in the last 30 days? (Cigarettes, Smokeless Tobacco, Cigars, and/or Pipes): No  Has this patient used any form of tobacco in the last 30 days? (Cigarettes, Smokeless Tobacco, Cigars, and/or Pipes): N/A  Blood Alcohol level:  No results found for: [facility]  Metabolic Disorder Labs:  No results found for: HGBA1C, MPG No results found for: PROLACTIN No results found for: CHOL, TRIG, HDL, CHOLHDL, VLDL, LDLCALC  See Psychiatric Specialty Exam and Suicide Risk Assessment completed by Attending Physician  prior to discharge.  Discharge destination:  Home  Is patient on multiple antipsychotic therapies at discharge:  No   Has Patient had three or more failed trials of antipsychotic monotherapy by history:  No  Recommended Plan for Multiple Antipsychotic Therapies: NA  Allergies as of 01/12/2018      Reactions   Shellfish Allergy    Hives,nausea      Medication List    TAKE these medications     Indication  escitalopram 5 MG tablet Commonly known as:  LEXAPRO Take 1 tablet (5 mg total) by mouth daily. For depression Start taking on:  01/13/2018  Indication:  Major Depressive Disorder   hydrOXYzine 25 MG tablet Commonly known as:  ATARAX/VISTARIL Take 1 tablet (25 mg total) by mouth every 6 (six) hours as needed for anxiety.  Indication:  Feeling Anxious   traZODone 50 MG tablet Commonly known as:  DESYREL Take 0.5 tablets (25 mg total) by mouth at bedtime as needed for sleep.  Indication:  Trouble Sleeping      Follow-up Information    [facility] Follow up.   Why:  Dr Shawn Stall instructed me to tell you to come to the walk-in clinic between 9 and 4 either the day of d/c or the next day. Contact information: [address] [place] [address]         Follow-up recommendations: Activity:  As tolerated Diet: As recommended by your primary care doctor. Keep all scheduled follow-up appointments as recommended.  Comments: Patient is instructed prior to discharge to: Take all medications as prescribed by his/her mental healthcare provider. Report any adverse effects and or reactions from the medicines to his/her outpatient provider promptly. Patient has been instructed & cautioned: To not engage in alcohol and or illegal drug use while on prescription medicines. In the event of worsening symptoms, patient is instructed to call the crisis hotline, 911 and or go to the nearest ED for appropriate evaluation and treatment of symptoms. To  follow-up with his/her primary care provider for your other medical issues, concerns and or health care needs.   Signed: Lindell Spar, NP, PMHNP, FNP-BC 01/12/2018, 3:47 PM   Patient seen, Suicide Assessment Completed.  Disposition Plan Reviewed

## 2018-01-12 NOTE — Plan of Care (Signed)
  Problem: Education: Goal: Emotional status will improve Outcome: Progressing   Problem: Education: Goal: Mental status will improve Outcome: Progressing   Problem: Education: Goal: Verbalization of understanding the information provided will improve Outcome: Progressing   Patient self inventory- Patient slept well last night, sleep medication was requested and was helpful. Appetite has been fair, energy level normal, concentration good. Depression, hopelessness, and anxiety rated 3, 1, 1 out of 10. Denies SI HI AVH. Patient's goal is "keeping my peace of mind."  Patient is compliant with medications prescribed by provider. Safety is maintained with 15 minute checks as well as environmental checks. Will continue to monitor.

## 2018-01-12 NOTE — Progress Notes (Signed)
Adult Psychoeducational Group Note  Date:  01/12/2018 Time:  1:45 AM  Group Topic/Focus:  Wrap-Up Group:   The focus of this group is to help patients review their daily goal of treatment and discuss progress on daily workbooks.  Participation Level:  Active  Participation Quality:  Appropriate  Affect:  Appropriate  Cognitive:  Appropriate  Insight: Appropriate  Engagement in Group:  Engaged  Modes of Intervention:  Discussion  Additional Comments:  Pt stated she did not have any goals.  Pt stated that she had a visit with two of her best friends.  Her friends told her that she has become increasingly negative and they felt being around her recently had become toxic.  Pt expressed that her feelings were hurt but she was so glad that her friends were honest with her because it forced her to take a real look at herself and her actions.  Pt stated that she agreed with her friends and stated it was time to work on herself and her own happiness.  Pt stated that she also needed to work on understanding that other people have issues going on their lives, too.  Pt rated the day at a 7/10.    Rennae Ferraiolo 01/12/2018, 1:45 AM

## 2018-01-12 NOTE — [facility] Group Notes (Signed)
[facility] LCSW Group Therapy Note  Date/Time: 01/12/18, 1315  Type of Therapy/Topic:  Group Therapy:  Balance in Life  Participation Level:  minimal  Description of Group:    This group will address the concept of balance and how it feels and looks when one is unbalanced. Patients will be encouraged to process areas in their lives that are out of balance, and identify reasons for remaining unbalanced. Facilitators will guide patients utilizing problem- solving interventions to address and correct the stressor making their life unbalanced. Understanding and applying boundaries will be explored and addressed for obtaining  and maintaining a balanced life. Patients will be encouraged to explore ways to assertively make their unbalanced needs known to significant others in their lives, using other group members and facilitator for support and feedback.  Therapeutic Goals: 1. Patient will identify two or more emotions or situations they have that consume much of in their lives. 2. Patient will identify signs/triggers that life has become out of balance:  3. Patient will identify two ways to set boundaries in order to achieve balance in their lives:  4. Patient will demonstrate ability to communicate their needs through discussion and/or role plays  Summary of Patient Progress:Pt shared that famly, mental/emotional, and friends are areas that are currently out of balance.  Pt mostly quiet during group discussion but did respond to CSW questions.           Therapeutic Modalities:   Cognitive Behavioral Therapy Solution-Focused Therapy Assertiveness Training  Daleen [place], [place]

## 2018-01-12 NOTE — Progress Notes (Addendum)
[facility] MD Progress Note  01/12/2018 3:03 PM Hayley Diaz  MRN:  [id number]   Subjective: Patient reports today that she feels that she is doing much better and she feels that she is ready to be discharged home.  She reports that she slept well last night and that she has had a good appetite.  She states that she feels that she is here for really no reason now and is ready to be discharged home.  She states that her mother visited her last night and agreed to contact her mom except that her mother was having cataract surgery this morning and mom would possibly be available in the afternoon.  Patient denies any suicidal or homicidal ideations and denies any hallucinations.  Patient also states agreement to stay safe on the unit.  Patient denies any medication side effects and states that she has plans to continue the medications and get her follow-up appointments. Patient's mother, Laurel Dimmer, was contacted and she stated that the patient has improved and that she felt safe with the patient being discharged tomorrow.  Patient's mother denied any concerns for safety with the patient being discharged.  Objective: Patient's chart and findings reviewed and discussed with treatment team.  Patient presents in her room lying in the bed.  Patient is pleasant, calm, and cooperative.  Patient has been seen interacting on the milieu.  Patient has denied any symptoms since yesterday.  Feel that patient is possibly ready for discharge.  Principal Problem: MDD (major depressive disorder), recurrent episode, severe ([place]) Diagnosis:   Patient Active Problem List   Diagnosis Date Noted  . MDD (major depressive disorder), recurrent episode, severe ([place]) [F33.2] 01/10/2018   Total Time spent with patient: 20 minutes  Past Psychiatric History: See H&P  Past Medical History: History reviewed. No pertinent past medical history. History reviewed. No pertinent surgical history. Family History: History reviewed. No  pertinent family history. Family Psychiatric  History: See H&P Social History:  Social History   Substance and Sexual Activity  Alcohol Use Yes     Social History   Substance and Sexual Activity  Drug Use Not Currently    Social History   Socioeconomic History  . Marital status: Single    Spouse name: Not on file  . Number of children: Not on file  . Years of education: Not on file  . Highest education level: Not on file  Occupational History  . Not on file  Social Needs  . Financial resource strain: Not on file  . Food insecurity:    Worry: Not on file    Inability: Not on file  . Transportation needs:    Medical: Not on file    Non-medical: Not on file  Tobacco Use  . Smoking status: Never Smoker  . Smokeless tobacco: Never Used  Substance and Sexual Activity  . Alcohol use: Yes  . Drug use: Not Currently  . Sexual activity: Yes    Birth control/protection: None  Lifestyle  . Physical activity:    Days per week: Not on file    Minutes per session: Not on file  . Stress: Not on file  Relationships  . Social connections:    Talks on phone: Not on file    Gets together: Not on file    Attends religious service: Not on file    Active member of club or organization: Not on file    Attends meetings of clubs or organizations: Not on file    Relationship  status: Not on file  Other Topics Concern  . Not on file  Social History Narrative  . Not on file   Additional Social History:    Pain Medications: see MAR Prescriptions: see MAR Over the Counter: see MAR History of alcohol / drug use?: No history of alcohol / drug abuse Longest period of sobriety (when/how long): patient denies drug or alcohol use                    Sleep: Good  Appetite:  Good  Current Medications: Current Facility-Administered Medications  Medication Dose Route Frequency Provider Last Rate Last Dose  . acetaminophen (TYLENOL) tablet 650 mg  650 mg Oral Q6H PRN Neale Marzette,  Myer Peer, MD      . alum & mag hydroxide-simeth (MAALOX/MYLANTA) 200-200-20 MG/5ML suspension 30 mL  30 mL Oral Q6H PRN Sheriff Rodenberg A, MD      . escitalopram (LEXAPRO) tablet 5 mg  5 mg Oral Daily Nevaeh Casillas, Myer Peer, MD   5 mg at 01/12/18 0844  . hydrOXYzine (ATARAX/VISTARIL) tablet 25 mg  25 mg Oral Q6H PRN Shatika Grinnell, Myer Peer, MD   25 mg at 01/11/18 2228  . traZODone (DESYREL) tablet 25 mg  25 mg Oral QHS PRN,MR X 1 Marthena Whitmyer, Myer Peer, MD   25 mg at 01/11/18 2228    Lab Results:  Results for orders placed or performed during the hospital encounter of 01/10/18 (from the past 48 hour(s))  CBC with Differential/Platelet     Status: None   Collection Time: 01/11/18  6:42 AM  Result Value Ref Range   WBC 4.3 4.0 - 10.5 K/uL   RBC 4.86 3.87 - 5.11 MIL/uL   Hemoglobin 13.1 12.0 - 15.0 g/dL   HCT 41.2 36.0 - 46.0 %   MCV 84.8 78.0 - 100.0 fL   MCH 27.0 26.0 - 34.0 pg   MCHC 31.8 30.0 - 36.0 g/dL   RDW 13.7 11.5 - 15.5 %   Platelets 251 150 - 400 K/uL   Neutrophils Relative % 38 %   Neutro Abs 1.7 1.7 - 7.7 K/uL   Lymphocytes Relative 50 %   Lymphs Abs 2.2 0.7 - 4.0 K/uL   Monocytes Relative 9 %   Monocytes Absolute 0.4 0.1 - 1.0 K/uL   Eosinophils Relative 2 %   Eosinophils Absolute 0.1 0.0 - 0.7 K/uL   Basophils Relative 1 %   Basophils Absolute 0.0 0.0 - 0.1 K/uL    Comment: Performed at [facility], [place] [address], [place], [place] [address]  Comprehensive metabolic panel     Status: None   Collection Time: 01/11/18  6:42 AM  Result Value Ref Range   Sodium 143 135 - 145 mmol/L   Potassium 3.9 3.5 - 5.1 mmol/L   Chloride 109 98 - 111 mmol/L   CO2 24 22 - 32 mmol/L   Glucose, Bld 95 70 - 99 mg/dL   BUN 10 6 - 20 mg/dL   Creatinine, Ser 0.77 0.44 - 1.00 mg/dL   Calcium 9.9 8.9 - 10.3 mg/dL   Total Protein 8.0 6.5 - 8.1 g/dL   Albumin 4.4 3.5 - 5.0 g/dL   AST 20 15 - 41 U/L   ALT 15 0 - 44 U/L   Alkaline Phosphatase 42 38 - 126 U/L   Total Bilirubin 0.8  0.3 - 1.2 mg/dL   GFR calc non Af Amer >60 >60 mL/min   GFR calc Af Amer >60 >60 mL/min  Comment: (NOTE) The eGFR has been calculated using the CKD EPI equation. This calculation has not been validated in all clinical situations. eGFR's persistently <60 mL/min signify possible Chronic Kidney Disease.    Anion gap 10 5 - 15    Comment: Performed at [facility], [place] [address], [place], [place] [address]  TSH     Status: None   Collection Time: 01/11/18  6:42 AM  Result Value Ref Range   TSH 3.009 0.350 - 4.500 uIU/mL    Comment: Performed by a 3rd Generation assay with a functional sensitivity of <=0.01 uIU/mL. Performed at [facility], [place] [address] [place], [place] [address]   Rapid urine drug screen (hospital performed)     Status: None   Collection Time: 01/11/18  6:48 AM  Result Value Ref Range   Opiates NONE DETECTED NONE DETECTED   Cocaine NONE DETECTED NONE DETECTED   Benzodiazepines NONE DETECTED NONE DETECTED   Amphetamines NONE DETECTED NONE DETECTED   Tetrahydrocannabinol NONE DETECTED NONE DETECTED   Barbiturates NONE DETECTED NONE DETECTED    Comment: (NOTE) DRUG SCREEN FOR MEDICAL PURPOSES ONLY.  IF CONFIRMATION IS NEEDED FOR ANY PURPOSE, NOTIFY LAB WITHIN 5 DAYS. LOWEST DETECTABLE LIMITS FOR URINE DRUG SCREEN Drug Class                     Cutoff (ng/mL) Amphetamine and metabolites    1000 Barbiturate and metabolites    200 Benzodiazepine                 379 Tricyclics and metabolites     300 Opiates and metabolites        300 Cocaine and metabolites        300 THC                            50 Performed at [facility], [place] [address] [place], [place] [address]   Pregnancy, urine     Status: None   Collection Time: 01/11/18  6:48 AM  Result Value Ref Range   Preg Test, Ur NEGATIVE NEGATIVE    Comment:        THE SENSITIVITY OF THIS METHODOLOGY IS >20 mIU/mL. Performed at [facility], [place] [address] [place], [place] [address]   Urinalysis, Routine w reflex microscopic     Status: Abnormal   Collection Time: 01/11/18  6:48 AM  Result Value Ref Range   Color, Urine AMBER (A) YELLOW    Comment: BIOCHEMICALS MAY BE AFFECTED BY COLOR   APPearance HAZY (A) CLEAR   Specific Gravity, Urine 1.031 (H) 1.005 - 1.030   pH 5.0 5.0 - 8.0   Glucose, UA NEGATIVE NEGATIVE mg/dL   Hgb urine dipstick NEGATIVE NEGATIVE   Bilirubin Urine NEGATIVE NEGATIVE   Ketones, ur NEGATIVE NEGATIVE mg/dL   Protein, ur NEGATIVE NEGATIVE mg/dL   Nitrite NEGATIVE NEGATIVE   Leukocytes, UA NEGATIVE NEGATIVE    Comment: Performed at [facility], [place] [address], [place], [place] [address]    Blood Alcohol level:  No results found for: [facility]  Metabolic Disorder Labs: No results found for: HGBA1C, MPG No results found for: PROLACTIN No results found for: CHOL, TRIG, HDL, CHOLHDL, VLDL, LDLCALC  Physical Findings: AIMS: Facial and Oral Movements Muscles of Facial Expression: None, normal Lips and Perioral Area: None, normal Jaw: None, normal Tongue: None, normal,Extremity Movements Upper (  arms, wrists, hands, fingers): None, normal Lower (legs, knees, ankles, toes): None, normal, Trunk Movements Neck, shoulders, hips: None, normal, Overall Severity Severity of abnormal movements (highest score from questions above): None, normal Incapacitation due to abnormal movements: None, normal Patient's awareness of abnormal movements (rate only patient's report): No Awareness, Dental Status Current problems with teeth and/or dentures?: No Does patient usually wear dentures?: No  CIWA:    COWS:     Musculoskeletal: Strength & Muscle Tone: within normal limits Gait & Station: normal Patient leans: N/A  Psychiatric Specialty Exam: Physical Exam  Nursing note and vitals reviewed. Constitutional: She is oriented to person, place, and time. She appears  well-developed and well-nourished.  Cardiovascular: Normal rate.  Respiratory: Effort normal.  Musculoskeletal: Normal range of motion.  Neurological: She is alert and oriented to person, place, and time.  Skin: Skin is warm.    Review of Systems  Constitutional: Negative.   HENT: Negative.   Eyes: Negative.   Respiratory: Negative.   Cardiovascular: Negative.   Gastrointestinal: Negative.   Genitourinary: Negative.   Musculoskeletal: Negative.   Skin: Negative.   Neurological: Negative.   Endo/Heme/Allergies: Negative.   Psychiatric/Behavioral: Negative.     Blood pressure 107/77, pulse 65, temperature 98.2 F (36.8 C), temperature source Oral, resp. rate 18, height _0  (1.727 m), weight 72.6 kg.Body mass index is 24.33 kg/m.  General Appearance: Casual  Eye Contact:  Good  Speech:  Clear and Coherent and Normal Rate  Volume:  Normal  Mood:  Euthymic  Affect:  Congruent  Thought Process:  Goal Directed and Descriptions of Associations: Intact  Orientation:  Full (Time, Place, and Person)  Thought Content:  WDL  Suicidal Thoughts:  No  Homicidal Thoughts:  No  Memory:  Immediate;   Good Recent;   Good Remote;   Good  Judgement:  Fair  Insight:  Fair  Psychomotor Activity:  Normal  Concentration:  Concentration: Good and Attention Span: Good  Recall:  Good  Fund of Knowledge:  Good  Language:  Good  Akathisia:  No  Handed:  Right  AIMS (if indicated):     Assets:  Communication Skills Desire for Improvement Financial Resources/Insurance Housing Physical Health Social Support Transportation  ADL's:  Intact  Cognition:  WNL  Sleep:  Number of Hours: 6.25   Problems addressed MDD severe recurrent  Treatment Plan Summary: Daily contact with patient to assess and evaluate symptoms and progress in treatment, Medication management and Plan is to: Continue Lexapro 5 mg p.o. daily for mood stability Continue trazodone 25 mg p.o. nightly for insomnia Continue  Vistaril 25 mg p.o. every 6 hours as needed for anxiety Encourage group therapy participation Discharge plan for tomorrow to be discharged home with her mother.  [place], FNP 01/12/2018, 3:03 PM    ..Agree with NP Progress Note

## 2018-01-12 NOTE — Progress Notes (Signed)
Discharge note: Patient reviewed discharge paperwork with RN including prescriptions, follow up appointments, and lab work. Patient given the opportunity to ask questions. All concerns were addressed. All belongings were returned to patient. Denied SI/HI/AVH. Patient thanked staff for their care while at the hospital.  Patient was discharged to lobby where her step father was waiting to pick her up.

## 2018-01-12 NOTE — Progress Notes (Signed)
  [facility] Adult Case Management Discharge Plan :  Will you be returning to the same living situation after discharge:  Yes,  with parent At discharge, do you have transportation home?: Yes,  Benedetto Goad or friend Do you have the ability to pay for your medications: Yes,  [employer]  Release of information consent forms completed and in the chart;  Patient's signature needed at discharge.  Patient to Follow up at: Follow-up Information    [facility] Follow up.   Why:  Dr Deloria Lair instructed me to tell you to come to the walk-in clinic between 9 and 4 either the day of d/c or the next day. Contact information: [address] [place] [address] [phone] F: [phone]          Next level of care provider has access to [facility] Link:no  Safety Planning and Suicide Prevention discussed: Yes,  with mother  Have you used any form of tobacco in the last 30 days? (Cigarettes, Smokeless Tobacco, Cigars, and/or Pipes): No  Has patient been referred to the Quitline?: N/A patient is not a smoker  Patient has been referred for addiction treatment: N/A  Lorri Frederick, LCSW 01/12/2018, 3:42 PM

## 2018-01-12 NOTE — BHH Suicide Risk Assessment (Signed)
[facility] Discharge Suicide Risk Assessment   Principal Problem: MDD (major depressive disorder), recurrent episode, severe ([facility]) Discharge Diagnoses:  Patient Active Problem List   Diagnosis Date Noted  . MDD (major depressive disorder), recurrent episode, severe ([facility]) [F33.2] 01/10/2018    Total Time spent with patient: 30 minutes  Musculoskeletal: Strength & Muscle Tone: within normal limits Gait & Station: normal Patient leans: N/A  Psychiatric Specialty Exam: ROS  No headache, no chest pain , no shortness of breath, no vomiting, no rash  Blood pressure 107/77, pulse 65, temperature 98.2 F (36.8 C), temperature source Oral, resp. rate 18, height 5\' 8"  (1.727 m), weight 72.6 kg.Body mass index is 24.33 kg/m.  General Appearance: Well Groomed  Eye Contact::  Good  Speech:  Normal Rate409  Volume:  Normal  Mood:  reports mood has improved, states "  my mood is pretty good today"  Affect:  Appropriate and reactive   Thought Process:  Linear and Descriptions of Associations: Intact  Orientation:  Full (Time, Place, and Person)  Thought Content:  no hallucinations, no delusions, not internally preoccupied   Suicidal Thoughts:  No denies suicidal or self injurious ideations, no homicidal or violent ideations   Homicidal Thoughts:  No  Memory:  recent and remote grossly intact   Judgement:  Other:  fair- improving   Insight:  fair- improving   Psychomotor Activity:  Normal  Concentration:  Good  Recall:  Good  Fund of Knowledge:Good  Language: Good  Akathisia:  Negative  Handed:  Right  AIMS (if indicated):     Assets:  Desire for Improvement Resilience  Sleep:  Number of Hours: 6.25  Cognition: WNL  ADL's:  Intact   Mental Status Per Nursing Assessment::   On Admission:  Suicidal ideation indicated by patient, Self-harm thoughts  Demographic Factors:  22, single, Senior in college, lives with mother  Loss Factors: Relationship stressors with mother, being Holiday representative in  college   Historical Factors: No prior psychiatric admissions, no history of suicide attempts, history of depression  Risk Reduction Factors:   Sense of responsibility to family, Living with another person, especially a relative, Positive coping skills or problem solving skills and invested in college  Continued Clinical Symptoms:  Patient presents alert, attentive, well groomed, good eye contact, mood described as "OK", denies depression at this time, affect appropriate, no thought disorder, no suicidal or self injurious ideations, denies homicidal or violent ideations, no hallucinations, no delusions, not internally preoccupied, future oriented. States that she is planning on returning to college to complete her college studies and then plans to go on to [employer].  States she is interested in outpatient psychotherapy . No agitated behaviors, presents calm.  Thus far tolerating medication ( Lexapro) well , without side effects- side effects reviewed, including risk of GI side effects and potential for increased suicidal ideations in young adults early in treatment with antidepressants . Patient reports good visit /converation with mother yesterday evening. With patient's express consent I have spoken with her mother, who visited her yesterday. Mother corroborates patient doing well, improved and is in agreement with discharge.   Cognitive Features That Contribute To Risk:  No gross cognitive deficits noted upon discharge. Is alert , attentive, and oriented x 3    Suicide Risk:  Mild:  Suicidal ideation of limited frequency, intensity, duration, and specificity.  There are no identifiable plans, no associated intent, mild dysphoria and related symptoms, good self-control (both objective and subjective assessment), few other risk factors,  and identifiable protective factors, including available and accessible social support.  Follow-up Information    [facility] [employer] Follow up.    Why:  Dr Deloria Lair instructed me to tell you to come to the walk-in clinic between 9 and 4 either the day of d/c or the next day. Contact information: [address] [place] [address] [phone] F: [phone]          Plan Of Care/Follow-up recommendations:  Activity:  as tolerated  Diet:  regular Tests:  NA Other:  See below Patient requesting discharge , no current grounds for involuntary commitment . Leaving unit in good spirits  She is planning to return to live with her mother, stepfather will pick her up later today. Plans to follow up as above .  Craige Cotta, MD 01/12/2018, 3:32 PM

## 2018-01-13 NOTE — Plan of Care (Signed)
  Problem: Education: Goal: Knowledge of [facility] Education information/materials will improve Outcome: Adequate for Discharge   Problem: Education: Goal: Emotional status will improve Outcome: Adequate for Discharge   Problem: Education: Goal: Mental status will improve Outcome: Adequate for Discharge
